# Patient Record
Sex: Male | Born: 1940 | Race: White | Hispanic: No | Marital: Married
Health system: Midwestern US, Community
[De-identification: ages and names within clinical notes are randomized; demographics above are authoritative.]

## PROBLEM LIST (undated history)

## (undated) DIAGNOSIS — I951 Orthostatic hypotension: Principal | ICD-10-CM

---

## 2013-11-11 ENCOUNTER — Inpatient Hospital Stay
Admit: 2013-11-11 | Discharge: 2013-11-11 | Disposition: A | Discharge status: Home / Self Care | Attending: Emergency Medicine

## 2013-11-11 LAB — STREP SCREEN GROUP A THROAT: Rapid Strep A Screen: NEGATIVE

## 2013-11-11 MED ORDER — PSEUDOEPH-BROMPHEN-DM 30-2-10 MG/5ML PO SYRP
2-30-10 MG/5ML | Freq: Four times a day (QID) | ORAL | Status: DC | PRN
Start: 2013-11-11 — End: 2018-09-02

## 2013-11-11 NOTE — ED Provider Notes (Signed)
HPI Comments: Currently on antibiotic for pneumonia, worried about strep throat  Travelling a lot in the last 9 days    Patient is a 73 y.o. male presenting with throat problem. The history is provided by the patient.   Pharyngitis  Location:  Generalized  Quality:  Aching  Severity:  Moderate  Onset quality:  Gradual  Duration:  2 days  Progression:  Worsening  Chronicity:  New  Associated symptoms: no abdominal pain, no adenopathy, no chest pain, no chills, no cough, no ear pain, no eye discharge, no fever, no headaches, no rash and no shortness of breath        Review of Systems   Constitutional: Negative for fever and chills.   HENT: Negative for ear pain, sinus pressure and sore throat.    Eyes: Negative for pain, discharge and redness.   Respiratory: Negative for cough, shortness of breath and wheezing.    Cardiovascular: Negative for chest pain.   Gastrointestinal: Negative for nausea, vomiting, abdominal pain and diarrhea.   Genitourinary: Negative for dysuria and frequency.   Musculoskeletal: Negative for back pain and arthralgias.   Skin: Negative for rash and wound.   Neurological: Negative for weakness and headaches.   Hematological: Negative for adenopathy.   All other systems reviewed and are negative.      Physical Exam   Constitutional: He is oriented to person, place, and time. He appears well-developed and well-nourished.   HENT:   Head: Normocephalic and atraumatic.   Right Ear: Hearing, tympanic membrane and external ear normal.   Left Ear: Hearing, tympanic membrane and external ear normal.   Nose: Mucosal edema present. Right sinus exhibits no maxillary sinus tenderness and no frontal sinus tenderness. Left sinus exhibits no maxillary sinus tenderness and no frontal sinus tenderness.   Mouth/Throat: Uvula is midline, oropharynx is clear and moist and mucous membranes are normal. No trismus in the jaw. No uvula swelling.   Eyes: Conjunctivae, EOM and lids are normal. Pupils are equal, round,  and reactive to light.   Neck: Normal range of motion. Neck supple.   Cardiovascular: Normal rate, regular rhythm and normal heart sounds.    No murmur heard.  Pulmonary/Chest: Effort normal and breath sounds normal. No respiratory distress. He has no wheezes. He has no rales.   Abdominal: Soft. Bowel sounds are normal. There is no tenderness. There is no rigidity, no rebound, no guarding and no CVA tenderness.   Musculoskeletal: He exhibits no edema.   Neurological: He is alert and oriented to person, place, and time. He has normal strength. No cranial nerve deficit or sensory deficit. Coordination and gait normal. GCS eye subscore is 4. GCS verbal subscore is 5. GCS motor subscore is 6.   Skin: Skin is warm and dry. No abrasion and no rash noted.   Nursing note and vitals reviewed.      Procedures    MDM    --------------------------------------------- PAST HISTORY ---------------------------------------------  Past Medical History:  has no past medical history on file.    Past Surgical History:  has no past surgical history on file.    Social History:  reports that he has never smoked. He does not have any smokeless tobacco history on file.    Family History: family history is not on file.     The patient???s home medications have been reviewed.    Allergies: Review of patient's allergies indicates no known allergies.    -------------------------------------------------- RESULTS -------------------------------------------------  Results for orders placed during  the hospital encounter of 11/11/13   STREP SCREEN GROUP A THROAT       Result Value Range    Rapid Strep A Screen Negative  Negative    Rapid A Strep Antigen QC see below            ------------------------- NURSING NOTES AND VITALS REVIEWED ---------------------------   The nursing notes within the ED encounter and vital signs as below have been reviewed.   BP 130/71    Pulse 67    Temp(Src) 98.9 ??F (37.2 ??C) (Oral)    Resp 20    Ht 6\' 2"  (1.88 m)    Wt 195  lb (88.451 kg)    BMI 25.03 kg/m2      SpO2 99%   Oxygen Saturation Interpretation: Normal      ------------------------------------------ PROGRESS NOTES ------------------------------------------   I have spoken with the patient and discussed today???s results, in addition to providing specific details for the plan of care and counseling regarding the diagnosis and prognosis.  Their questions are answered at this time and they are agreeable with the plan.      --------------------------------- ADDITIONAL PROVIDER NOTES ---------------------------------          This patient is stable for discharge.  I have shared the specific conditions for return, as well as the importance of follow-up.        Delma OfficerAsheesh A Pai Dhungat, MD  11/11/13 [phone]

## 2013-11-13 LAB — CULTURE, THROAT

## 2018-09-02 ENCOUNTER — Inpatient Hospital Stay
Admit: 2018-09-02 | Discharge: 2018-09-02 | Disposition: A | Discharge status: Home / Self Care | Payer: BLUE CROSS/BLUE SHIELD

## 2018-09-02 DIAGNOSIS — J209 Acute bronchitis, unspecified: Secondary | ICD-10-CM

## 2018-09-02 MED ORDER — AZITHROMYCIN 250 MG PO TABS
250 MG | PACK | ORAL | 0 refills | Status: AC
Start: 2018-09-02 — End: 2018-09-12

## 2018-09-02 MED ORDER — MUCINEX DM MAXIMUM STRENGTH 60-1200 MG PO TB12
60-1200 MG | ORAL_TABLET | Freq: Two times a day (BID) | ORAL | 0 refills | Status: AC | PRN
Start: 2018-09-02 — End: ?

## 2018-09-02 NOTE — ED Provider Notes (Signed)
78 year old male the presents to urgent care complaining of cough and chest congestion primarily.  Does have some URI symptoms.  Denies any history of smoking.          Review of Systems   Constitutional:        Pertinent positives and negatives are stated within HPI, all other systems reviewed and are negative.       Physical Exam  Vitals signs and nursing note reviewed.   Constitutional:       Appearance: He is well-developed.   HENT:      Head: Normocephalic and atraumatic.      Jaw: No trismus.      Right Ear: Hearing, tympanic membrane, ear canal and external ear normal.      Left Ear: Hearing, tympanic membrane, ear canal and external ear normal.      Nose: Nose normal.      Right Sinus: No maxillary sinus tenderness or frontal sinus tenderness.      Left Sinus: No maxillary sinus tenderness or frontal sinus tenderness.      Mouth/Throat:      Pharynx: Oropharynx is clear. Uvula midline. No uvula swelling.   Eyes:      General: Lids are normal.      Conjunctiva/sclera: Conjunctivae normal.      Pupils: Pupils are equal, round, and reactive to light.   Neck:      Musculoskeletal: Normal range of motion and neck supple.   Cardiovascular:      Rate and Rhythm: Normal rate and regular rhythm.      Heart sounds: Normal heart sounds. No murmur.   Pulmonary:      Effort: Pulmonary effort is normal.      Breath sounds: Normal breath sounds.      Comments: Mild slightly moist cough.  Abdominal:      General: Bowel sounds are normal.      Palpations: Abdomen is soft. Abdomen is not rigid.      Tenderness: There is no abdominal tenderness. There is no guarding or rebound.   Skin:     General: Skin is warm and dry.      Findings: No abrasion or rash.   Neurological:      Mental Status: He is alert and oriented to person, place, and time.      GCS: GCS eye subscore is 4. GCS verbal subscore is 5. GCS motor subscore is 6.      Cranial Nerves: No cranial nerve deficit.      Sensory: No sensory deficit.      Coordination:  Coordination normal.      Gait: Gait normal.         Procedures    MDM    --------------------------------------------- PAST HISTORY ---------------------------------------------  Past Medical History:  has a past medical history of Cancer (HCC).    Past Surgical History:  has no past surgical history on file.    Social History:  reports that he has never smoked. He does not have any smokeless tobacco history on file.    Family History: family history is not on file.     The patient's home medications have been reviewed.    Allergies: Patient has no known allergies.    -------------------------------------------------- RESULTS -------------------------------------------------  No results found for this visit on 09/02/18.  No orders to display       ------------------------- NURSING NOTES AND VITALS REVIEWED ---------------------------   The nursing notes within the ED encounter and vital signs  as below have been reviewed.   BP 100/65   Pulse 73   Temp 98.8 F (37.1 C) (Oral)   Resp 16   Wt 195 lb (88.5 kg)   SpO2 95%   BMI 25.04 kg/m   Oxygen Saturation Interpretation: Normal      ------------------------------------------ PROGRESS NOTES ------------------------------------------   I have spoken with the patient and discussed today's results, in addition to providing specific details for the plan of care and counseling regarding the diagnosis and prognosis.  Their questions are answered at this time and they are agreeable with the plan.      --------------------------------- ADDITIONAL PROVIDER NOTES ---------------------------------     This patient is stable for discharge.  I have shared the specific conditions for return, as well as the importance of follow-up.      * NOTE: This report was transcribed using voice recognition software. Every effort was made to ensure accuracy; however, inadvertent computerized transcription errors may be present.    --------------------------------- IMPRESSION AND  DISPOSITION ---------------------------------    IMPRESSION  1. Acute bronchitis, unspecified organism        DISPOSITION  Disposition: Discharge to home  Patient condition is good         Sherryl Barters, PA-C  09/02/18 1313

## 2022-08-29 NOTE — Progress Notes (Signed)
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 Burr Medico in [place] in 2013  -TRT continued with Androgel in [facility] July 2020   Viagra stopped working a few months ago.  Switched to Cialis and also failed.  Libido is strong on TRT.  N openile curvature.  No dysuria-hematuria.      Has script for Trimix 30/1/10 ; 30 mg papaverine, 1 mg phentolamine, 10 mcg alprostadil / ml  [facility] [place]   Stopped TRT Fall 2023 - was not working     [place]:     Alert and oriented  MOTIVATION TO LEARN:  Eager  FAMILY  SUPPORT:  married , not here   INSTRUCTION PROVIDED TO:  Patient  PATIENT LEARNS BEST BY:   Individual Instruction  FACTORS AFFECTING LEARNING: None  PHYSICAL LIMITATIONS AFFECTING LEARNING:    None  METHOD OF INSTRUCTION:  Individual instruction    EDUCATION TOPIC/  TEACHING POINTS:   Survival Skills:   History of Drug and long term effects reviewed with patient. yes  Discussed with patient to NEVER increase the dose without first calling.yes  The Following Possible Complications were Reviewed:  Bleeding yes  Discomfort and resistance yes  Infection yes  Scarring yes  Priapism yes  Care of Medication discussed yes  Anatomy of penis reviewed.yes  Syringe and needle size reviewed, 29 gauge 1/2 inch U100. yes  Sterility Emphasized:yes  Syringe preparation discussed and demonstrated. yes  Emphasized removing air bubbles. yes  Patient practiced preparing syringe 2 times. yes  Skin preparation discussed and done by patient. yes  Patient injected medication. yes  Patient compressed injection site 3-5 minutes. yes  Usage of medication discussed:  (1 time per 24 hours 2-3 times a week). yes  Patient told to rotate sites. yes  Patient told to keep notes on injections.  yes  Patient instructed to call after using x1. yes    LEARNING RESPONSE  PATIENT / FAMILY RESPONSE:     Performs skill independently: Anatomy of penis   Syringe and needle size   Sterility principles  Syringe preparation and injection technique  Verbalizes understanding of: History of drug and long term effects   Dose increase   Possible complications   Care of medication   Anatomy of penis   Syringe and needle size   Sterility principles  Syringe preparation and injection technique  FOLLOW-UP PLAN REVIEWED:    yes  SUPPLEMENTAL MATERIAL:    Written instructions given to the patient and read in the office.yes  REFERRAL (RECOMMENDATION):   None   Injection results:   10 units Trimix 30/1/10 ; 30 mg papaverine, 1 mg phentolamine, 10 mcg alprostadil / ml   At 3: 48   Homegoing medication: 20 units.    Pt aware to report to [facility] emergency room for erection lasting longer than 3-4 hrs. He is to bring the medication with him to ER.    I spent 45 minutes in the visit, with more than 50% of the total face-to-face time of the visit in counseling / coordination of care.  Proper use of medication, side effects, and expected outcomes discussed.      Electronically Signed  By Irma Newness, APRN.CNP   In  Department:  UROLOGY

## 2022-10-02 ENCOUNTER — Inpatient Hospital Stay
Admit: 2022-10-02 | Discharge: 2022-10-02 | Disposition: A | Discharge status: Home / Self Care | Payer: BLUE CROSS/BLUE SHIELD | Attending: Emergency Medicine

## 2022-10-02 DIAGNOSIS — N4830 Priapism, unspecified: Secondary | ICD-10-CM

## 2022-10-02 MED ORDER — SODIUM CHLORIDE 0.9 % IV BOLUS
0.9 | Freq: Once | INTRAVENOUS | Status: AC
Start: 2022-10-02 — End: 2022-10-02
  Administered 2022-10-02: 23:00:00 1000 mL via INTRAVENOUS

## 2022-10-02 MED ORDER — PSEUDOEPHEDRINE HCL 30 MG PO TABS
30 | Freq: Once | ORAL | Status: AC
Start: 2022-10-02 — End: 2022-10-02
  Administered 2022-10-02: 23:00:00 30 mg via ORAL

## 2022-10-02 MED FILL — SUDOGEST 30 MG PO TABS: 30 MG | ORAL | Qty: 1

## 2022-10-02 NOTE — ED Provider Notes (Cosign Needed)
[facility] EMERGENCY DEPARTMENT  EMERGENCY DEPARTMENT ENCOUNTER        Pt Name: Jesus Morgan  MRN: [id number]  Birthdate [birth year]  Date of evaluation: 10/02/2022  Provider: Cleon Gustin, DO  PCP: No primary care provider on file.  Note Started: 5:45 PM EST 10/02/22    CHIEF COMPLAINT       Chief Complaint   Patient presents with    Other     INJECTION MED INTO PENIS STILL HAS ERECTION  AFTER 4 HOURS       HISTORY OF PRESENT ILLNESS: 1 or more Elements   History From: Patient    Limitations to history : None    Jesus Morgan is a 82 y.o. male with past medical history of unspecified cancer who presents to the emergency department due to prolonged erection.  Patient states that he injected his penis with a medication to establish an erection.  He elicits prolonged erection for the last 4 and half hours.  This is only slightly improving since arrival to the emergency department.  Patient denies any penile pain.  He has no other symptoms otherwise including nausea, vomiting, lightheadedness, dizziness, syncope, chest pain, shortness of breath, abdominal pain, flank pain, urinary symptoms or bowel changes.  Patient is still able to urinate without difficulty.  He states that he has done this 3 other times and has not had a problem with a prolonged erection.  He denies any trauma or injury to the penis.    Nursing Notes were all reviewed and agreed with or any disagreements were addressed in the HPI.    ROS:   Pertinent positives and negatives are stated within HPI, all other systems reviewed and are negative.    --------------------------------------------- PAST HISTORY ---------------------------------------------  Past Medical History:  has a past medical history of Cancer (HCC).    Past Surgical History:  has no past surgical history on file.    Social History:  reports that he has never smoked. He does not have any smokeless tobacco history on file.    Family History: family  history is not on file.     The patient's home medications have been reviewed.    Allergies: Patient has no known allergies.    ---------------------------------------------------PHYSICAL EXAM--------------------------------------  Constitutional/General: well appearing, non toxic in NAD  Neck: Supple, full ROM  Pulmonary: Lungs clear to auscultation bilaterally, no wheezes, rales, or rhonchi. Not in respiratory distress  Cardiovascular:  Regular rate. Regular rhythm.   Abdomen: Soft.  Non tender. Non distended.   No rebound, guarding, or rigidity.   GU: With chaperone, nurse present.  Penis is erect but overall soft with no ischemic color changes.  Skin is warm and pink.  Musculoskeletal: Moves all extremities x 4. Warm and well perfused, no clubbing, cyanosis, or edema.  Skin: warm and dry. No rashes.   Psych: Normal Affect    -------------------------------------------------- RESULTS -------------------------------------------------  I have personally reviewed all laboratory and imaging results for this patient. Results are listed below.     LABS:  No results found for this visit on 10/02/22.    RADIOLOGY:   Interpretation per the Radiologist below, if available at the time of this note:    No orders to display     No results found.    No results found.    ------------------------- NURSING NOTES AND VITALS REVIEWED ---------------------------   The nursing notes within the ED encounter and vital signs as below have been reviewed by myself.  BP 132/74   Pulse 78   Temp 98.3 F (36.8 C)   Resp 20   Wt 83.9 kg (185 lb)   SpO2 98%   BMI 23.75 kg/m   Oxygen Saturation Interpretation: Normal    The patient's available past medical records and past encounters were reviewed.        ------------------------------ ED COURSE/MEDICAL DECISION MAKING----------------------  Medications   sodium chloride 0.9 % bolus 1,000 mL (0 mLs IntraVENous Stopped 10/02/22 1823)   pseudoephedrine (SUDAFED) tablet 30 mg (30 mg Oral  Given 10/02/22 1823)       Medical Decision Making/Differential Diagnosis:    CC/HPI Summary, Pertinent Physical Exam Findings, Social Determinants of health, Records Reviewed, DDx, testing done/not done, ED Course, Reassessment, disposition considerations/shared decision making with patient, consults, disposition:        Medical Decision Making:   I, Dr. Cleon Gustin am the resident physician of record.      History From: Patient    Limitations to history : None     Jesus Morgan is a 82 y.o. male who presents to the ED for prolonged erection.  Vital signs upon arrival BP 132/74   Pulse 78   Temp 98.3 F (36.8 C)   Resp 20   Wt 83.9 kg (185 lb)   SpO2 98%   BMI 23.75 kg/m     On initial evaluation, patient is nontoxic-appearing, afebrile, hemodynamically stable and in no acute distress.  Initial vitals were notable limits.  Differential diagnosis includes was not limited to priapism, Peyronie's, penile fracture, phimosis or paraphimosis.  Physical exam was remarkable for penile erection with no ischemic changes to the penis.  Skin was warm and pink.  No tense firmness.  No other concerning physical exam findings.  Lungs are clear auscultation bilaterally.  Heart regular rhythm with normal rate.  Abdomen soft, nontender nondistended.  Skin normal color with no paleness or jaundice.  No other concerning physical exam findings.  Urology was consulted and advised draining the penis as well as giving Sudafed.  Procedure was completed the emergency department and patient tolerated procedure well.  A moderate amount of blood was aspirated from the penis which resolved the priapism.  Patient was observed in the emergency department and remained asymptomatic without return of his presenting symptoms.  Discussed plan for discharge with patient who is agreeable.  Return precautions were discussed and patient was discharged in stable condition.  He was advised to follow-up with his neurologist and PCP as an outpatient  as needed for further evaluation.    Medications this visit include:   Orders Placed This Encounter   Medications    sodium chloride 0.9 % bolus 1,000 mL    pseudoephedrine (SUDAFED) tablet 30 mg       Is this patient to be included in the SEP-1 core measure? No Exclusion criteria - the patient is NOT to be included for SEP-1 Core Measure due to: Infection is not suspected    No imaging studies indicated for this visit    Discussion with Other Professionals: See ED course  CONSULTS: discussion with bolded "IP consult", otherwise consult was likely placed by admitting service  None    Social Determinants : None    Records Reviewed : Other office visit from 08/27/2022 reviewed.  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 Mosetta Putt in [facility] in 2013  -TRT continued with Androgel in [facility] July 2020   Viagra stopped working a few months ago. Switched to Cialis and also failed. Libido is strong on TRT. N openile curvature. No dysuria-hematuria.   Has script for Trimix 30/1/10 ; 30 mg papaverine, 1 mg phentolamine, 10 mcg alprostadil / ml  [facility] [place]   Stopped TRT Fall 2023 - was not working"     Chronic conditions:  unspecified cancer     CONSULTS: Urology      Disposition:   Appropriate for outpatient management      Pt will be d/c and will follow up with his PCP . He is educated on signs and symptoms that require emergent evaluation. Pt is advised to return to the ED if his symptoms change or worsen. If his pain persists, pt may need further evaluation. Pt is agreeable to plan and all questions have been answered at this time.      1. Priapism          Re-Evaluations/Consultations:             ED Course as of 10/02/22 Jesus Morgan Oct 02, 2022   1718 Reviewed medication patient to inject his  penis: Papaverine plus phentolamine (Bimix) [PP]   1726 I spoke with urology concerning the patient.  They recommend that we drain the penis with a 18-gauge needle on the side of the corpus callosum.  Also advised to give Sudafed and consider phenylephrine injection if drainage is not enough.  Give IV fluids and oxygen to the patient as well for the procedure [PP]   1840 Patient re-evaluated. Penis is still flaccid after procedure. He is having no pain or new symptoms. He is agreeable to discharge. Return precautions discussed including but not limited to continued erection, penile pain, lightheadedness, dizziness or syncope.  [PP]   1840   ATTENDING PROVIDER ATTESTATION:     I have personally performed and/or participated in the history, exam, medical decision making, and procedures and agree with all pertinent clinical information unless otherwise noted.    I have also reviewed and agree with the past medical, family and social history unless otherwise noted.    I have discussed this patient in detail with the resident and provided the instruction and education regarding the evidence-based evaluation and treatment of prolonged erection.    Any EKG that may have been performed has been personally reviewed by me and I agree with the documentation as noted by the resident.    History: patient used an injection to obtain an erection.  It has not gone down and its over 4 hours.  He denies pain.  This has never occurred before.    My findings: Jesus Morgan is a 82 y.o. male whom is in no distress. Physical exam reveals erected penis.  Abdomen is soft and nontender.    My plan: Symptomatic and supportive care. We drained the extra blood with good results.    Electronically signed by Susy Manor, DO on 10/02/22 at 6:47 PM EST       [JS]      ED Course User Index  [JS] Stefanucci-Uberti, Noreene Larsson, DO  [PP] Cleon Gustin, DO         This patient's ED course included: History, physical examination, reevaluation prior  to disposition    This patient has remained hemodynamically stable during their ED course.    Counseling:  The emergency provider has spoken with the patient and discussed today's results, in addition to providing specific details for the plan of care and counseling regarding the diagnosis and prognosis.  Questions are answered at this time and they are agreeable with the plan.       --------------------------------- IMPRESSION AND DISPOSITION ---------------------------------    IMPRESSION  1. Priapism        DISPOSITION  Disposition: Discharge to home  Patient condition is stable        NOTE: This report was transcribed using voice recognition software. Every effort was made to ensure accuracy; however, inadvertent computerized transcription errors may be present  \

## 2022-10-02 NOTE — Discharge Instructions (Addendum)
Thank you for the opportunity to serve in your medical care today. Please be sure to take your prescribed medication as directed. Follow up with your doctor is critical for optimal healing. Should you have any new or worsening symptoms, please return to the Emergency Department for further work-up and evaluation.

## 2023-10-26 IMAGING — MR MRA BRAIN WITHOUT CONTRAST
3 of 5 series · 7 of 48 positions shown · non-contrast
Comparison: None.

________________________________________________________________________________________________ 
MRA BRAIN WITHOUT CONTRAST, 10/26/2023 [DATE]: 
CLINICAL INDICATION: Lightheadedness, dizziness.
TECHNIQUE: MR arteriography with MIPs of the brain is performed with computer 
reformatting of the source data to create arteriographic images. Patient was 
scanned on a 1.5 Tesla magnet.

[Series 301: 3d_tof_cow · axial · 1.0mm · 0.38mm/px · z∈[-50,+18]mm · 3 of 200 slices shown]
[im 32/200]
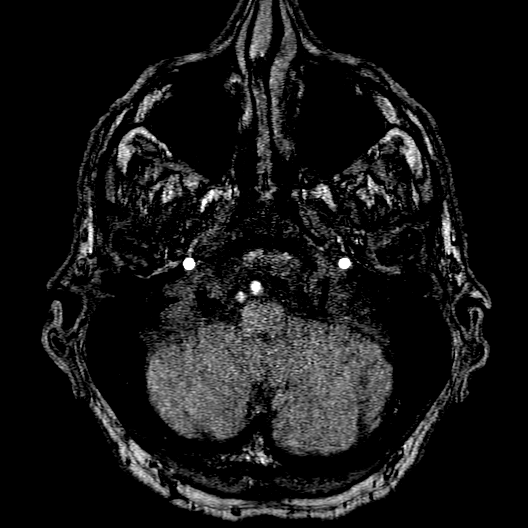
[im 104/200]
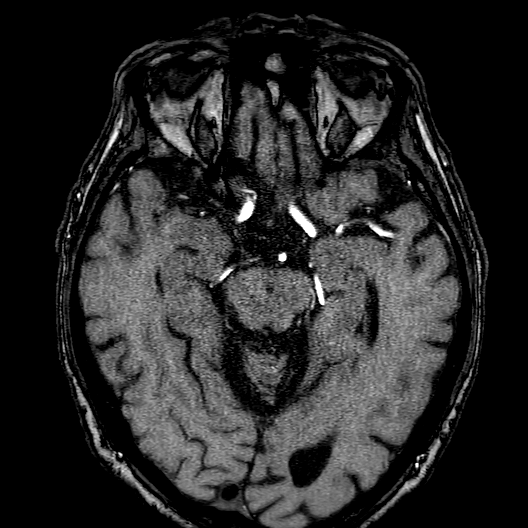
[im 168/200]
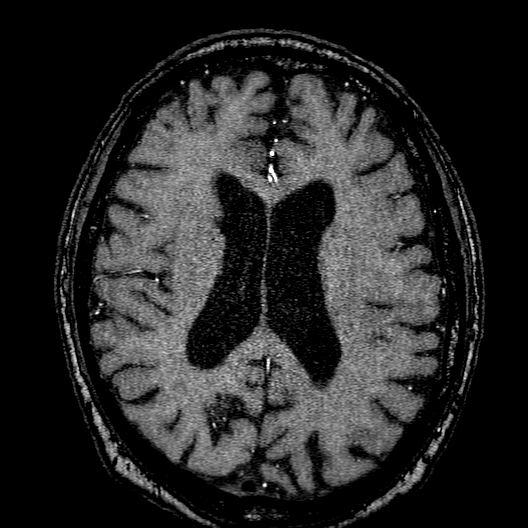

[Series 303: cor mpr · coronal · 2.0mm · 0.20mm/px · 3 of 65 slices shown]
[im 10/65]
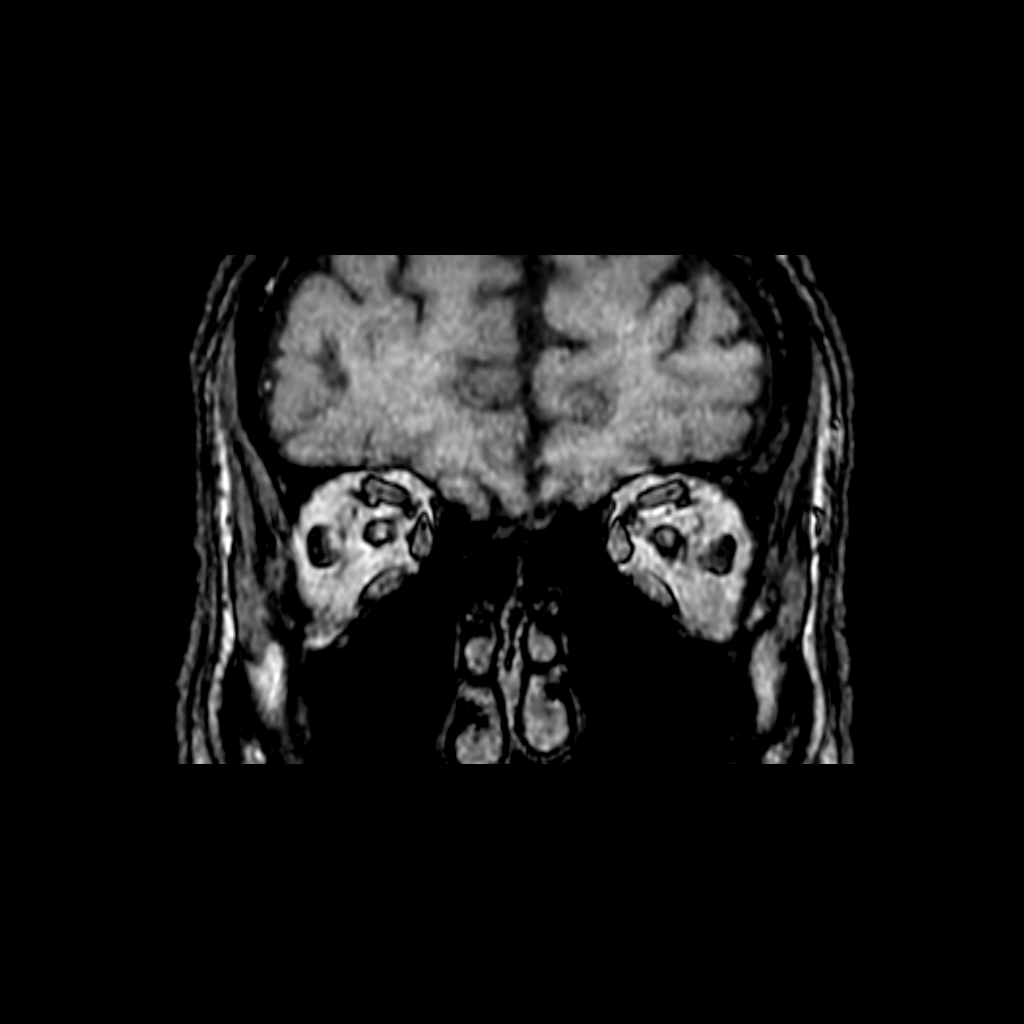
[im 37/65]
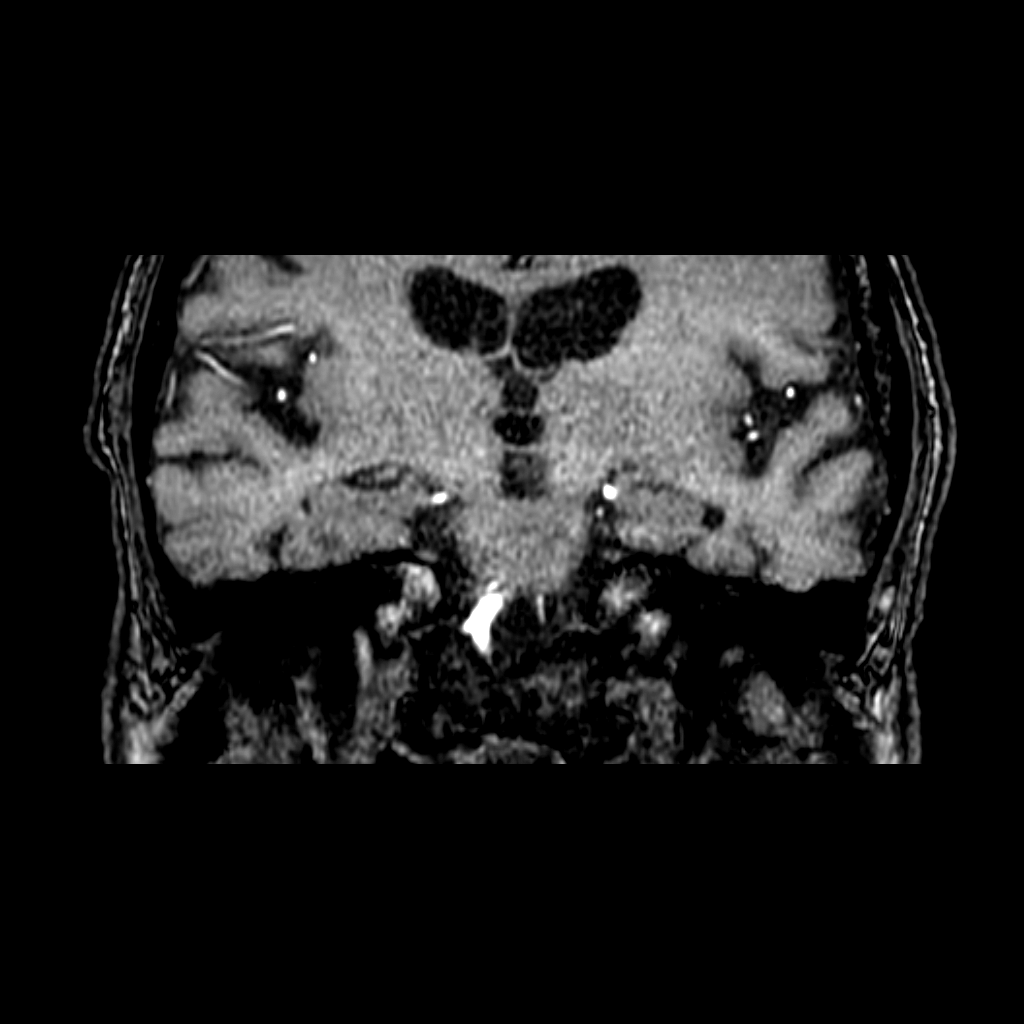
[im 55/65]
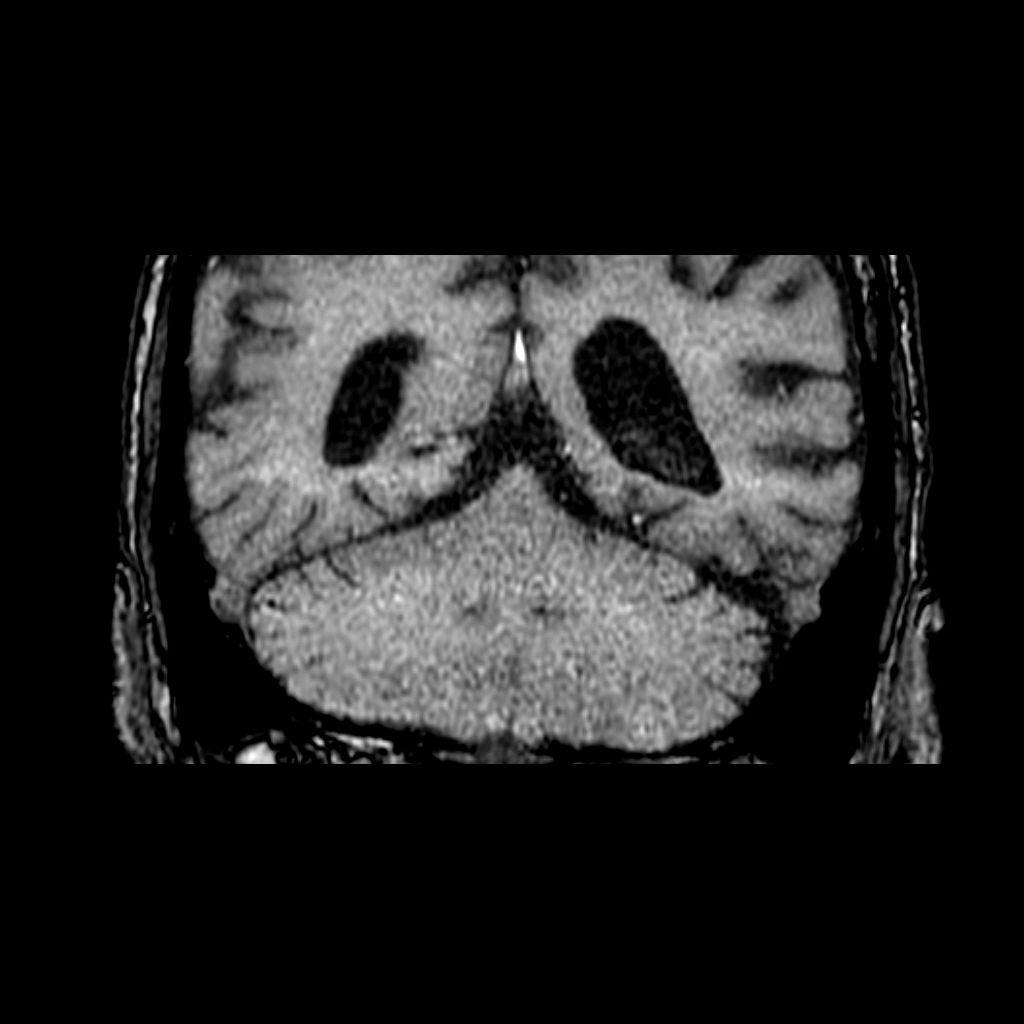

[Series 304: sag mpr · sagittal · 2.0mm · 0.20mm/px · 1 of 65 slices shown]
[im 10/65]
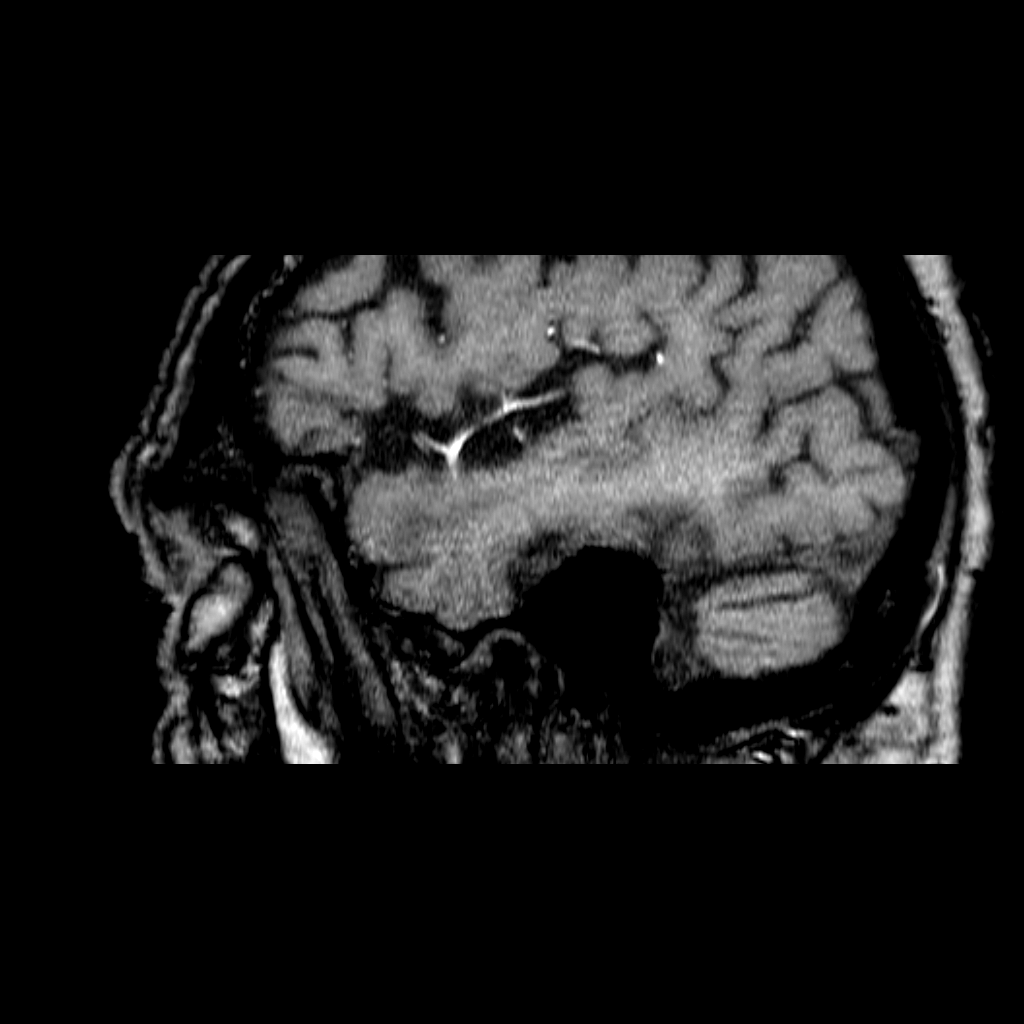

[7 of 48 positions shown; findings below may reference images not displayed]

FINDINGS: RIGHT ANTERIOR CIRCULATION: Distal internal carotid artery, anterior and middle 
cerebral arteries are patent without significant stenosis. 

LEFT ANTERIOR CIRCULATION: Distal internal carotid artery, anterior and middle 
cerebral arteries are patent without significant stenosis. 

POSTERIOR CIRCULATION: Distal vertebral arteries, basilar artery, and proximal 
posterior cerebral arteries are patent without significant stenosis. 

ANEURYSM/AVM: No aneurysm. Please note, MRA is less sensitive for aneurysms less 
than 4 mm in size.  There is no vascular nidus to suggest high-grade 
arteriovenous malformation.  
OTHER: No acute ischemia in the brain. Suspected encephalomalacia along the left 
thalamus and right caudate head from chronic ischemic changes.
IMPRESSION: 1.  Patency of intracranial vasculature. 
2.  Chronic ischemic changes along the right caudate head and left thalamus.

## 2023-10-26 IMAGING — MR MRA CAROTID WITHOUT CONTRAST
6 series · 16 of 16 positions shown · non-contrast
Comparison: None.

________________________________________________________________________________________________ 
MRA CAROTID WITHOUT CONTRAST, 10/26/2023 [DATE]:
INDICATION: Lightheadedness. Dizziness.
TECHNIQUE: 2D TOF through the neck and 3D TOF through the carotid bifurcations. 
MIP reconstructions performed.

[Series 501: survey · axial · 10.0mm · 1.07mm/px · 1 of 10 slices shown]
[im 1/10]
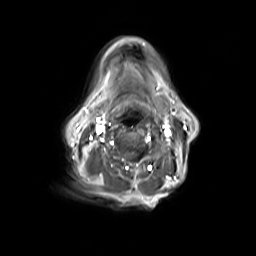

[Series 601: survey_pca · sagittal · 50.0mm · 1.17mm/px · 1 of 3 slices shown]
[im 1/3]
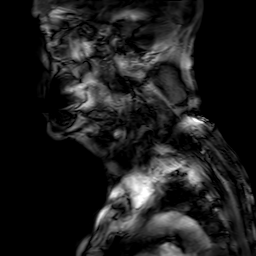

[Series 701: 3di_mc · axial · 1.2mm · 0.43mm/px · z∈[-178,-119]mm · 6 of 100 slices shown]
[im 1/100]
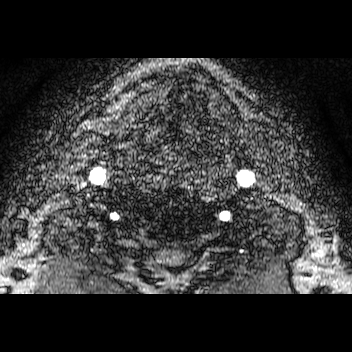
[im 20/100]
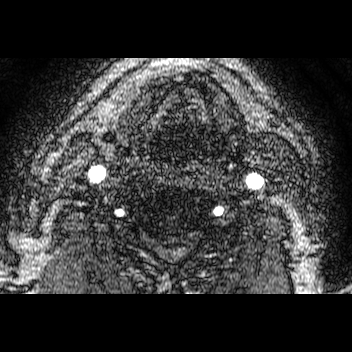
[im 40/100]
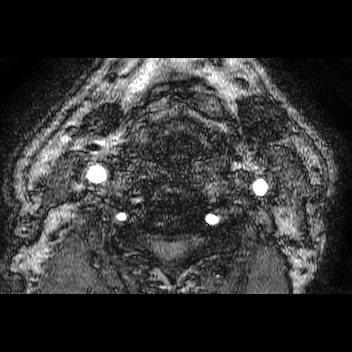
[im 60/100]
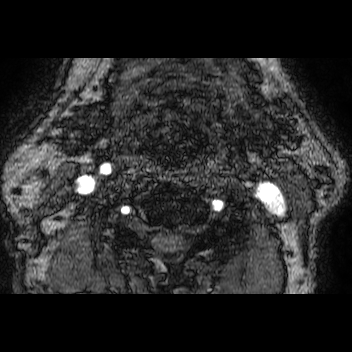
[im 80/100]
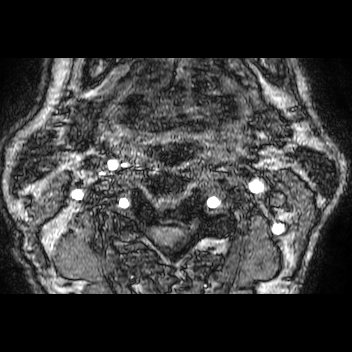
[im 100/100]
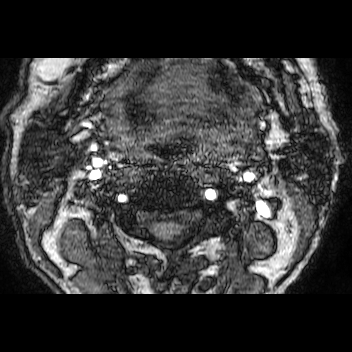

[Series 702: mip - 3di_mc · axial · 150.0mm · 0.43mm/px · 1 of 1 slices shown]
[im 1/1]
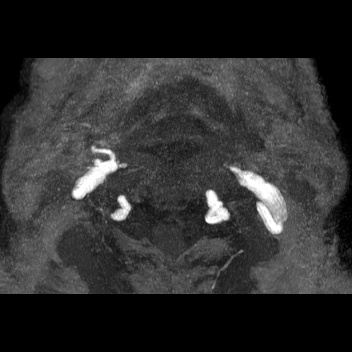

[Series 801: m2di · axial · 3.0mm · 0.44mm/px · z∈[-250,-55]mm · 6 of 100 slices shown]
[im 1/100]
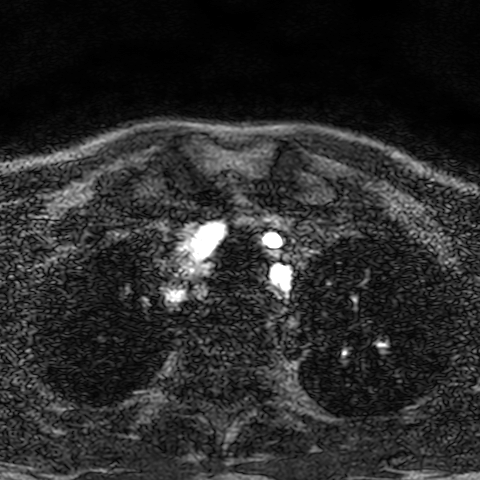
[im 20/100]
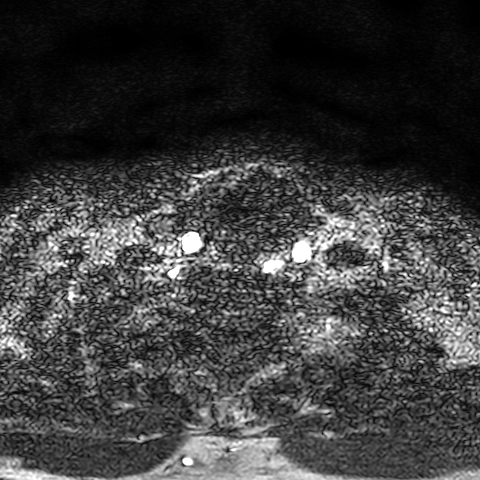
[im 40/100]
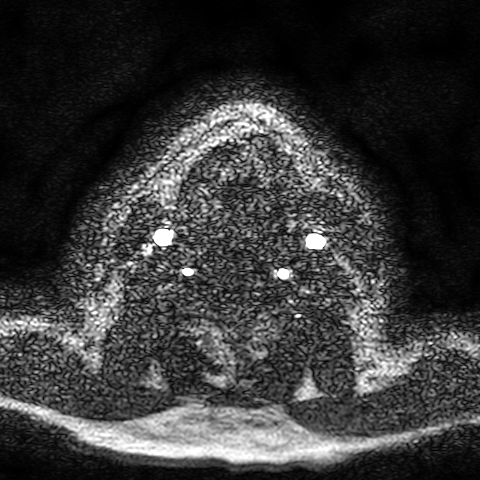
[im 60/100]
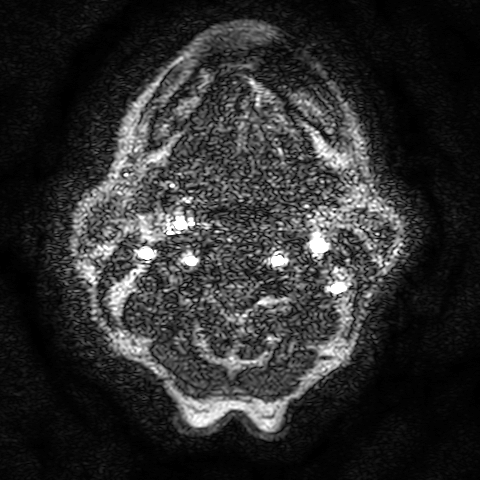
[im 80/100]
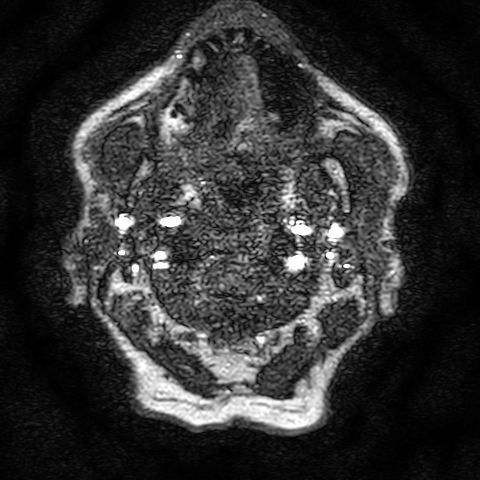
[im 100/100]
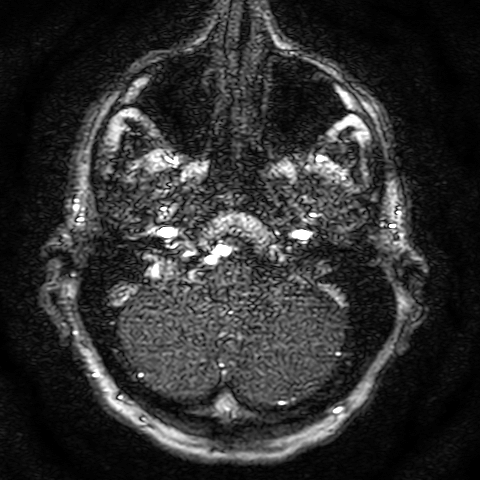

[Series 802: mip - m2di · axial · 210.0mm · 0.44mm/px · 1 of 1 slices shown]
[im 1/1]
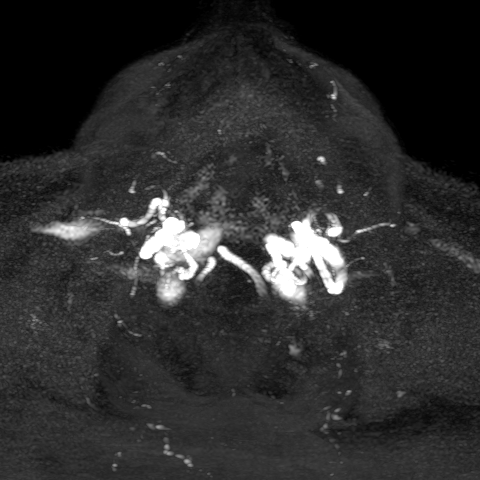

[16 of 16 positions shown; findings below may reference images not displayed]

FINDINGS: MRA of the neck demonstrates patency of the cervical vasculature without 
evidence of flow gap (flow gap correlates with greater than 70% stenosis based 
on NASCET criteria).  There is mild narrowing of the proximal left cervical 
internal carotid artery by atherosclerotic plaque.
IMPRESSION: 1.  Mild left proximal cervical ICA narrowing by atherosclerotic plaque.  
2.  Patency of cervical vasculature.

## 2023-12-09 ENCOUNTER — Inpatient Hospital Stay
Admit: 2023-12-09 | Discharge: 2023-12-09 | Disposition: A | Discharge status: Home / Self Care | Payer: Medicare (Managed Care) | Attending: Emergency Medicine

## 2023-12-09 ENCOUNTER — Emergency Department: Admit: 2023-12-09 | Payer: Medicare (Managed Care)

## 2023-12-09 DIAGNOSIS — R42 Dizziness and giddiness: Secondary | ICD-10-CM

## 2023-12-09 LAB — TROPONIN
Troponin, High Sensitivity: 13 ng/L (ref 0–22)
Troponin, High Sensitivity: 12 ng/L (ref 0–22)

## 2023-12-09 LAB — CBC WITH AUTO DIFFERENTIAL
Basophils %: 0 % (ref 0.0–2.0)
Basophils Absolute: 0.01 10*3/uL (ref 0.00–0.20)
Eosinophils %: 1 % (ref 0–6)
Eosinophils Absolute: 0.07 10*3/uL (ref 0.05–0.50)
Hematocrit: 39.2 % (ref 37.0–54.0)
Hemoglobin: 13.5 g/dL (ref 12.5–16.5)
Immature Granulocytes %: 0 % (ref 0.0–5.0)
Immature Granulocytes Absolute: <0.03 10*3/uL (ref 0.00–0.58)
Lymphocytes %: 19 % — ABNORMAL LOW (ref 20.0–42.0)
Lymphocytes Absolute: 1.2 10*3/uL — ABNORMAL LOW (ref 1.50–4.00)
MCH: 32.2 pg (ref 26.0–35.0)
MCHC: 34.4 g/dL (ref 32.0–34.5)
MCV: 93.6 fL (ref 80.0–99.9)
MPV: 10.7 fL (ref 7.0–12.0)
Monocytes %: 6 % (ref 2.0–12.0)
Monocytes Absolute: 0.38 10*3/uL (ref 0.10–0.95)
Neutrophils %: 73 % (ref 43.0–80.0)
Neutrophils Absolute: 4.49 10*3/uL (ref 1.80–7.30)
Platelets: 226 10*3/uL (ref 130–450)
RBC: 4.19 m/uL (ref 3.80–5.80)
RDW: 13.2 % (ref 11.5–15.0)
WBC: 6.2 10*3/uL (ref 4.5–11.5)

## 2023-12-09 LAB — BASIC METABOLIC PANEL
Anion Gap: 12 mmol/L (ref 7–16)
BUN: 18 mg/dL (ref 6–23)
CO2: 21 mmol/L — ABNORMAL LOW (ref 22–29)
Calcium: 8.7 mg/dL (ref 8.6–10.2)
Chloride: 105 mmol/L (ref 98–107)
Creatinine: 0.9 mg/dL (ref 0.70–1.20)
Est, Glom Filt Rate: 85 mL/min/{1.73_m2} (ref >60)
Glucose: 89 mg/dL (ref 74–99)
Potassium: 3.8 mmol/L (ref 3.5–5.0)
Sodium: 138 mmol/L (ref 132–146)

## 2023-12-09 LAB — BRAIN NATRIURETIC PEPTIDE: NT Pro-BNP: 420 pg/mL (ref 0–450)

## 2023-12-09 LAB — MAGNESIUM: Magnesium: 2 mg/dL (ref 1.6–2.6)

## 2023-12-09 MED ORDER — TETANUS-DIPHTHERIA TOXOIDS TD 5-2 LFU IM INJ
5-2 | Freq: Once | INTRAMUSCULAR | Status: DC
Start: 2023-12-09 — End: 2023-12-09

## 2023-12-09 NOTE — ED Notes (Signed)
 Ginger in er wr , please call pivot when able to visit

## 2023-12-09 NOTE — ED Provider Notes (Signed)
 83 year old male presenting with lightheadedness.  There was report of recurrent near syncope but no actual syncope.  Patient recently came back from [place]  where he spends his winters.  He was seeing a neurologist in [facility] for chronic neck pain.  He takes midodrine and Lyrica as the only 2 medications.  He cannot provide any other history, he does not have a family doctor, reports that he did see a cardiologist in [place]  when he was prescribed the midodrine.         No family history on file.  No past surgical history on file.    Review of Systems   Constitutional:  Negative for chills and fever.   Respiratory:  Negative for chest tightness and shortness of breath.    Neurological:  Positive for syncope and light-headedness.        Physical Exam  Constitutional:       General: He is not in acute distress.     Appearance: He is well-developed.   HENT:      Head: Normocephalic and atraumatic.   Eyes:      Pupils: Pupils are equal, round, and reactive to light.   Neck:      Thyroid: No thyromegaly.   Cardiovascular:      Rate and Rhythm: Normal rate and regular rhythm.   Pulmonary:      Effort: Pulmonary effort is normal. No respiratory distress.      Breath sounds: Normal breath sounds. No wheezing.   Abdominal:      General: There is no distension.      Palpations: Abdomen is soft. There is no mass.      Tenderness: There is no abdominal tenderness. There is no guarding or rebound.   Musculoskeletal:         General: No tenderness. Normal range of motion.      Cervical back: Normal range of motion and neck supple.   Skin:     General: Skin is warm and dry.      Findings: No erythema.      Comments: Abrasion to the right elbow   Neurological:      Mental Status: He is alert and oriented to person, place, and time.      Cranial Nerves: No cranial nerve deficit.   Psychiatric:         Mood and Affect: Mood normal.          Procedures     MDM       History From: Patient and spouse    CC/HPI Summary, DDx,  ED Course, Reassessment, Tests Considered, Patient expectation:   Patient presents with concern about lightheadedness.  He got out of bed this morning, had couple distinct episodes of lightheadedness.  He was started on midodrine about a year ago because he had recurrent and persistent orthostatic hypotension.  Upon arrival he is awake alert oriented x 4, no distress.  He has no chest pain or shortness of breath.  Considered STEMI, NSTEMI, ACS, hypertension, lightheadedness amongst many other possibilities.  No sign of STEMI or NSTEMI, he does not have ongoing symptoms.  He was a little bit lightheaded when standing up but had not taken his medication.  Blood pressure improved in the emergency department.  He will follow-up with him doctor, understands he return to the ED for any problems any worsening.    Social Determinants affecting Dx or Tx: Stress    Chronic Conditions: Orthostatic hypotension, cervical spinal stenosis, cervical radiculopathy  Records Reviewed: Telemedicine visit on 12/06/2023 by Dr. Arabella Beach with spinal stenosis in the cervical region, from neurology from [facility] clinic.      ED Course as of 12/09/23 1604   Sat Dec 09, 2023   1001 EKG: Interpreted by me  Sinus bradycardia, rate of 48, normal axis, no ST elevations or depressions no T wave abnormalities. [SO]   1208 Patient resting comfortably in no distress.  He is fully awake alert and oriented.  His wife describes a history of longstanding and known orthostatic hypotension.  He was given midodrine a little over a month ago and has done really well since then.  Today is the first time he is had any issues relating to it.  They slept in much later than normal and he did not take his medicine for he got up and have these episodes.  He takes 5 mg of the midodrine 3 times daily [SO]      ED Course User Index  [SO] Basilio Both, DO        ED Course as of 12/09/23 1604   Sat Dec 09, 2023   1001 EKG: Interpreted by me  Sinus bradycardia, rate of  48, normal axis, no ST elevations or depressions no T wave abnormalities. [SO]   1208 Patient resting comfortably in no distress.  He is fully awake alert and oriented.  His wife describes a history of longstanding and known orthostatic hypotension.  He was given midodrine a little over a month ago and has done really well since then.  Today is the first time he is had any issues relating to it.  They slept in much later than normal and he did not take his medicine for he got up and have these episodes.  He takes 5 mg of the midodrine 3 times daily [SO]      ED Course User Index  [SO] Basilio Both, DO       --------------------------------------------- PAST HISTORY ---------------------------------------------  Past Medical History:  has a past medical history of Cancer (HCC).    Past Surgical History:  has no past surgical history on file.    Social History:  reports that he has never smoked. He does not have any smokeless tobacco history on file.    Family History: family history is not on file.     The patient's home medications have been reviewed.    Allergies: Patient has no known allergies.    -------------------------------------------------- RESULTS -------------------------------------------------  Labs:  Results for orders placed or performed during the hospital encounter of 12/09/23   CBC with Auto Differential   Result Value Ref Range    WBC 6.2 4.5 - 11.5 k/uL    RBC 4.19 3.80 - 5.80 m/uL    Hemoglobin 13.5 12.5 - 16.5 g/dL    Hematocrit 16.1 09.6 - 54.0 %    MCV 93.6 80.0 - 99.9 fL    MCH 32.2 26.0 - 35.0 pg    MCHC 34.4 32.0 - 34.5 g/dL    RDW 04.5 40.9 - 81.1 %    Platelets 226 130 - 450 k/uL    MPV 10.7 7.0 - 12.0 fL    Neutrophils % 73 43.0 - 80.0 %    Lymphocytes % 19 (L) 20.0 - 42.0 %    Monocytes % 6 2.0 - 12.0 %    Eosinophils % 1 0 - 6 %    Basophils % 0 0.0 - 2.0 %    Immature Granulocytes % 0 0.0 -  5.0 %    Neutrophils Absolute 4.49 1.80 - 7.30 k/uL    Lymphocytes Absolute 1.20 (L) 1.50 -  4.00 k/uL    Monocytes Absolute 0.38 0.10 - 0.95 k/uL    Eosinophils Absolute 0.07 0.05 - 0.50 k/uL    Basophils Absolute 0.01 0.00 - 0.20 k/uL    Immature Granulocytes Absolute <0.03 0.00 - 0.58 k/uL   Basic Metabolic Panel   Result Value Ref Range    Sodium 138 132 - 146 mmol/L    Potassium 3.8 3.5 - 5.0 mmol/L    Chloride 105 98 - 107 mmol/L    CO2 21 (L) 22 - 29 mmol/L    Anion Gap 12 7 - 16 mmol/L    Glucose 89 74 - 99 mg/dL    BUN 18 6 - 23 mg/dL    Creatinine 0.9 1.61 - 1.20 mg/dL    Est, Glom Filt Rate 85 >60 mL/min/1.17m2    Calcium 8.7 8.6 - 10.2 mg/dL   Troponin   Result Value Ref Range    Troponin, High Sensitivity 13 0 - 22 ng/L   Brain Natriuretic Peptide   Result Value Ref Range    NT Pro-BNP 420 0 - 450 pg/mL   Magnesium   Result Value Ref Range    Magnesium 2.0 1.6 - 2.6 mg/dL   Troponin   Result Value Ref Range    Troponin, High Sensitivity 12 0 - 22 ng/L   EKG 12 Lead   Result Value Ref Range    Ventricular Rate 48 BPM    Atrial Rate 48 BPM    P-R Interval 158 ms    QRS Duration 92 ms    Q-T Interval 500 ms    QTc Calculation (Bazett) 446 ms    P Axis 63 degrees    R Axis 78 degrees    T Axis 78 degrees       Radiology:  XR CHEST PORTABLE   Final Result   No acute cardiopulmonary process.             ------------------------- NURSING NOTES AND VITALS REVIEWED ---------------------------  Date / Time Roomed:  12/09/2023  9:28 AM  ED Bed Assignment:  02/02    The nursing notes within the ED encounter and vital signs as below have been reviewed.   BP (!) 125/59   Pulse 52   Temp 97.7 F (36.5 C)   Resp 23   SpO2 95%   Oxygen Saturation Interpretation: Normal      ------------------------------------------ PROGRESS NOTES ------------------------------------------  I have spoken with the spouse and patient and discussed today's results, in addition to providing specific details for the plan of care and counseling regarding the diagnosis and prognosis.  Their questions are answered at this time and  they are agreeable with the plan. I discussed at length with them reasons for immediate return here for re evaluation. They will followup with primary care by calling their office tomorrow.      --------------------------------- ADDITIONAL PROVIDER NOTES ---------------------------------  At this time the patient is without objective evidence of an acute process requiring hospitalization or inpatient management.  They have remained hemodynamically stable throughout their entire ED visit and are stable for discharge with outpatient follow-up.     The plan has been discussed in detail and they are aware of the specific conditions for emergent return, as well as the importance of follow-up.      Discharge Medication List as of 12/09/2023  1:44 PM  Diagnosis:  1. Orthostatic lightheadedness        Disposition:  Patient's disposition: Discharge to home  Patient's condition is stable.              Basilio Both, DO  12/09/23 1606

## 2023-12-09 NOTE — Discharge Instructions (Signed)
 Continuity of Care Form    Patient Name: Jesus Morgan   DOB:  Oct 04, 1940  MRN:  [id number]    Admit date:  12/09/2023  Discharge date:  ***    Code Status Order: No Order   Advance Directives:     Admitting Physician:  No admitting provider for patient encounter.  PCP: No primary care provider on file.    Discharging Nurse: [facility] Unit/Room#: 02/02  Discharging Unit Phone Number: ***    Emergency Contact:   Extended Emergency Contact Information  Primary Emergency Contact: Mcbreen,Ginger  Address: [address]           [place], [place] [address] [place]  Home Phone: [phone]  Relation: Spouse    Past Surgical History:  No past surgical history on file.    Immunization History:   Immunization History   Administered Date(s) Administered    COVID-19, PFIZER Bivalent, DO NOT Dilute, (age 12y+), IM, 30 mcg/0.3 mL 04/26/2021    COVID-19, PFIZER GRAY top, DO NOT Dilute, (age 34 y+), IM, 30 mcg/0.3 mL 11/19/2020    COVID-19, PFIZER PURPLE top, DILUTE for use, (age 96 y+), 89mcg/0.3mL 09/21/2019, 05/28/2020    COVID-19, PFIZER, 2024/25, (age 12y+), IM, 21mcg/0.3mL 05/01/2023       Active Problems:  There is no problem list on file for this patient.      Isolation/Infection:   Isolation            No Isolation          Patient Infection Status    None to display         Nurse Assessment:  Last Vital Signs: BP (!) 125/59   Pulse 52   Temp 97.7 F (36.5 C)   Resp 23   SpO2 95%     Last documented pain score (0-10 scale):    Last Weight:   Wt Readings from Last 1 Encounters:   10/02/22 83.9 kg (185 lb)     Mental Status:  {IP PT MENTAL STATUS:20030}    IV Access:  {MH COC IV ACCESS:304088262}    Nursing Mobility/ADLs:  Walking   {CHP DME MVHQ:469629528}  Transfer  {CHP DME UXLK:440102725}  Bathing  {CHP DME DGUY:403474259}  Dressing  {CHP DME DGLO:756433295}  Toileting  {CHP DME JOAC:166063016}  Feeding  {CHP DME WFUX:323557322}  Med Admin  {CHP DME GURK:270623762}  Med Delivery   {MH COC  MED Delivery:304088264}    Wound Care Documentation and Therapy:        Elimination:  Continence:   Bowel: {YES / GB:15176}  Bladder: {YES / HY:07371}  Urinary Catheter: {Urinary Catheter:304088013}   Colostomy/Ileostomy/Ileal Conduit: {YES / GG:26948}       Date of Last BM: ***  No intake or output data in the 24 hours ending 12/09/23 1402  No intake/output data recorded.    Safety Concerns:     {MH COC Safety Concerns:304088272}    Impairments/Disabilities:      {MH COC Impairments/Disabilities:304088273}    Nutrition Therapy:  Current Nutrition Therapy:   {MH COC Diet List:304088271}    Routes of Feeding: {CHP DME Other Feedings:304088042}  Liquids: {Slp liquid thickness:30034}  Daily Fluid Restriction: {CHP DME Yes amt example:304088041}  Last Modified Barium Swallow with Video (Video Swallowing Test): {Done Not Done NIOE:703500938}    Treatments at the Time of Hospital Discharge:   Respiratory Treatments: ***  Oxygen Therapy:  {Therapy; copd oxygen:17808}  Ventilator:    {MH CC Vent HWEX:937169678}  Rehab Therapies: {THERAPEUTIC [phone]}  Weight Bearing Status/Restrictions: {MH CC Weight Bearing:304508812}  Other Medical Equipment (for information only, NOT a DME order):  {EQUIPMENT:304520077}  Other Treatments: ***    Patient's personal belongings (please select all that are sent with patient):  {CHP DME Belongings:304088044}    RN SIGNATURE:  {Esignature:304088025}    CASE MANAGEMENT/SOCIAL WORK SECTION    Inpatient Status Date: ***    Readmission Risk Assessment Score:  [facility] RISK OF UNPLANNED READMISSION 2.0             0 Total Score        Discharging to Facility/ Agency   Name:   Address:  Phone:  Fax:    Dialysis Facility (if applicable)   Name:  Address:  Dialysis Schedule:  Phone:  Fax:    Case Manager/Social Worker signature: {Esignature:304088025}    PHYSICIAN SECTION    Prognosis: {Prognosis:[phone]}    Condition at Discharge: {MH Patient Condition:304088024}    Rehab Potential  (if transferring to Rehab): {Prognosis:[phone]}    Recommended Labs or Other Treatments After Discharge: ***    Physician Certification: I certify the above information and transfer of Jesus Morgan  is necessary for the continuing treatment of the diagnosis listed and that he requires {Admit to Appropriate Level of Care:20763} for {GREATER/LESS:304500278} 30 days.     Update Admission H&P: {CHP DME Changes in WRUEA:540981191}    PHYSICIAN SIGNATURE:  {Esignature:304088025}

## 2023-12-10 LAB — EKG 12-LEAD
Atrial Rate: 48 {beats}/min
P Axis: 63 degrees
P-R Interval: 158 ms
Q-T Interval: 500 ms
QRS Duration: 92 ms
QTc Calculation (Bazett): 446 ms
R Axis: 78 degrees
T Axis: 78 degrees
Ventricular Rate: 48 {beats}/min

## 2024-09-05 ENCOUNTER — Inpatient Hospital Stay
Admit: 2024-09-05 | Discharge: 2024-09-05 | Disposition: A | Discharge status: Home / Self Care | Payer: Medicare (Managed Care) | Arrived: AM | Attending: Emergency Medicine

## 2024-09-05 DIAGNOSIS — I951 Orthostatic hypotension: Principal | ICD-10-CM

## 2024-09-05 LAB — CBC WITH AUTO DIFFERENTIAL
Basophils %: 0 % (ref 0.0–2.0)
Basophils Absolute: 0.02 10*3/uL (ref 0.00–0.20)
Eosinophils %: 2 % (ref 0–6)
Eosinophils Absolute: 0.11 10*3/uL (ref 0.05–0.50)
Hematocrit: 43.4 % (ref 37.0–54.0)
Hemoglobin: 14.6 g/dL (ref 12.5–16.5)
Immature Granulocytes %: 0 % (ref 0.0–5.0)
Immature Granulocytes Absolute: <0.03 10*3/uL (ref 0.00–0.58)
Lymphocytes %: 28 % (ref 20.0–42.0)
Lymphocytes Absolute: 1.68 10*3/uL (ref 1.50–4.00)
MCH: 30.9 pg (ref 26.0–35.0)
MCHC: 33.6 g/dL (ref 32.0–34.5)
MCV: 91.8 fL (ref 80.0–99.9)
MPV: 10.8 fL (ref 7.0–12.0)
Monocytes %: 7 % (ref 2.0–12.0)
Monocytes Absolute: 0.4 10*3/uL (ref 0.10–0.95)
Neutrophils %: 64 % (ref 43.0–80.0)
Neutrophils Absolute: 3.86 10*3/uL (ref 1.80–7.30)
Platelet, Fluorescence: 213 10*3/uL (ref 130–450)
RBC: 4.73 m/uL (ref 3.80–5.80)
RDW: 13.3 % (ref 11.5–15.0)
WBC: 6.1 10*3/uL (ref 4.5–11.5)

## 2024-09-05 LAB — BASIC METABOLIC PANEL
Anion Gap: 7 mmol/L (ref 7–16)
BUN: 15 mg/dL (ref 8–23)
CO2: 28 mmol/L (ref 22–29)
Calcium: 9.8 mg/dL (ref 8.8–10.2)
Chloride: 105 mmol/L (ref 98–107)
Creatinine: 0.9 mg/dL (ref 0.7–1.2)
Est, Glom Filt Rate: 82 mL/min/{1.73_m2} (ref >60)
Glucose: 95 mg/dL (ref 74–99)
Potassium: 4.6 mmol/L (ref 3.5–5.1)
Sodium: 140 mmol/L (ref 136–145)

## 2024-09-05 LAB — PLATELET CONFIRMATION

## 2024-09-05 LAB — TROPONIN: Troponin, High Sensitivity: 13 ng/L (ref 0–22)

## 2024-09-05 MED ORDER — SODIUM CHLORIDE 0.9 % IV BOLUS
0.9 | Freq: Once | INTRAVENOUS | Status: AC
Start: 2024-09-05 — End: 2024-09-05
  Administered 2024-09-05: 17:00:00 1000 mL via INTRAVENOUS

## 2024-09-05 NOTE — ED Notes (Signed)
 Ginger in er wr , please call pivot when able to visit

## 2024-09-05 NOTE — ED Provider Notes (Signed)
 [facility] EMERGENCY DEPARTMENT  EMERGENCY DEPARTMENT ENCOUNTER        Pt Name: Jesus Morgan  MRN: [id number]  Birthdate March 10, 1941  Date of evaluation: 09/05/2024  Provider: Ozell Hitchcock, DO  PCP: No primary care provider on file.  Note Started: 9:50 AM EST 09/05/24    CHIEF COMPLAINT       Chief Complaint   Patient presents with    Hypotension     Pt has a hx of hypotension, felt dizzy today, almost fell but wife caught him       HISTORY OF PRESENT ILLNESS: 1 or more Elements   History From: patient    Limitations to history : None    Jesus Morgan is a 84 y.o. male who presents to the ED for evaluation of a episode of lightheadedness and near syncope.  Patient has a history of hypotension and takes midodrine daily.  This morning he got lightheaded almost passed out.  Blood pressure was low.  Patient right now is asymptomatic.  Never experienced chest pain or shortness of breath.  Denied focal weakness or numbness.  No nausea or vomiting.  No diarrhea.  No recent URI symptoms consisting of cough or congestion.  Patient denies any confusion.  Denies any focal weakness or numbness.  No change in his vision.     Nursing Notes were all reviewed and agreed with or any disagreements were addressed in the HPI.      REVIEW OF EXTERNAL NOTE :        REVIEW OF SYSTEMS :           Positives and Pertinent negatives as per HPI.     SURGICAL HISTORY   History reviewed. No pertinent surgical history.    CURRENTMEDICATIONS       Previous Medications    DEXTROMETHORPHAN-GUAIFENESIN  (MUCINEX  DM MAXIMUM STRENGTH) 60-1200 MG TB12    Take 1 tablet by mouth every 12 hours as needed (cough)    TADALAFIL (CIALIS PO)    Take  by mouth.       ALLERGIES     Patient has no known allergies.    FAMILYHISTORY     History reviewed. No pertinent family history.     SOCIAL HISTORY       Social History     Tobacco Use    Smoking status: Never       SCREENINGS        Glasgow Coma Scale  Eye Opening: Spontaneous  Best  Verbal Response: Oriented  Best Motor Response: Obeys commands  Glasgow Coma Scale Score: 15                CIWA Assessment  BP: 137/70  Pulse: 50           PHYSICAL EXAM  1 or more Elements     ED Triage Vitals 09/05/24 0942   BP Girls Systolic BP Percentile Girls Diastolic BP Percentile Boys Systolic BP Percentile Boys Diastolic BP Percentile Temp Temp src Pulse   126/76 -- -- -- -- 98.2 F (36.8 C) -- 50      Respirations SpO2 Height Weight       16 98 % -- --           Constitutional/General: Alert and oriented x3  Head: Normocephalic and atraumatic  Eyes: PERRL, EOMI, conjunctiva normal, sclera non icteric  ENT:  Oropharynx clear, handling secretions,   Neck: Supple, full ROM, no stridor, no meningeal signs  Respiratory: Lungs  clear to auscultation bilaterally, no wheezes, rales, or rhonchi. Not in respiratory distress.  No conversational dyspnea or accessory muscle use  Cardiovascular:  Regular rate. Regular rhythm. No murmurs, no gallops, no rubs.  Chest: No chest wall tenderness  GI:  Abdomen Soft, Non tender, Non distended.  No rebound, guarding, or rigidity. No pulsatile masses.  Musculoskeletal: Moves all extremities x 4. Warm and well perfused, no clubbing, no cyanosis, no edema. Capillary refill <3 seconds  Integument: skin warm and dry. No rashes.  No diaphoresis or pallor  Neurologic: GCS 15, no focal deficits, symmetric strength 5/5 in the upper and lower extremities bilaterally  Psychiatric: Normal Affect            DIAGNOSTIC RESULTS   LABS:    Labs Reviewed   CBC WITH AUTO DIFFERENTIAL   PLATELET CONFIRMATION   TROPONIN   BASIC METABOLIC PANEL       As interpreted by me, the above displayed labs are abnormal. All other labs obtained during this visit were within normal range or not returned as of this dictation.      EKG Interpretation  Interpreted by emergency department physician, Ozell Hitchcock, DO      EKG Interpretation    Interpreted by emergency department physician    Rhythm: sinus  bradycardia  Rate: 50  Axis: normal  Ectopy: none  Conduction: normal  ST Segments: Normal  T Waves: normal  Q Waves: none    Clinical Impression: sinus bradycardia    Ozell Hitchcock, DO          RADIOLOGY:   Non-plain film images such as CT, Ultrasound and MRI are read by the radiologist. Plain radiographic images are visualized and preliminarily interpreted by the ED Provider with the below findings:      Interpretation per the Radiologist below, if available at the time of this note:    No orders to display     No results found.    No results found.    PROCEDURES   Unless otherwise noted below, none          CRITICAL CARE TIME (.cct)   NA    PAST MEDICAL HISTORY/Chronic Conditions Affecting Care      has a past medical history of Cancer (HCC).     EMERGENCY DEPARTMENT COURSE    Vitals:    Vitals:    09/05/24 0942 09/05/24 0945 09/05/24 1221 09/05/24 1351   BP: 126/76  137/70    Pulse: 50  50    Resp: 16   19   Temp: 98.2 F (36.8 C)      SpO2: 98% 98%  98%       Patient was given the following medications:  Medications   sodium chloride  0.9 % bolus 1,000 mL (1,000 mLs IntraVENous New Bag 09/05/24 1219)         Medical Decision Making/Differential Diagnosis:    CC/HPI Summary, Social Determinants of health, Records Reviewed, DDx, testing done/not done, ED Course, Reassessment, disposition considerations/shared decision making with patient, consults, disposition:      ED Course as of 09/05/24 1421   Thu Sep 05, 2024   1209 Patient resting comfortably in bed.  Currently asymptomatic.  Patient was orthostatic positive.  IV fluids ordered.  Patient's wife also states he is due for his noon dose of midodrine.  She will give it to him. [MS]   1420 Patient's orthostatics slightly positive however he does have a history of orthostasis.  They are overall  improved and he is feeling fine.  No symptoms.  He will be discharged home. [MS]      ED Course User Index  [MS] Fredric Sharper, DO        Medical Decision  Making  Amount and/or Complexity of Data Reviewed  Labs: ordered.  ECG/medicine tests: ordered.    Risk  Prescription drug management.        Patient presents to the ED for  evaluation of a episode of lightheadedness and near syncope.  Patient has a history of hypotension and takes midodrine daily.  This morning he got lightheaded almost passed out. Differential diagnoses included but not limited to orthostasis, cardiac dysrhythmia, electrolyte derangement, acute renal insufficiency, anginal equivalent. Workup in the ED consisted appropriate labs and imaging.  The following labs were evaluate interpreted by myself.  CBC shows no Insa leukocytosis or anemia.  BMP shows no electrolyte abnormalities or evidence of renal insufficiency.  Patient's troponin is 13 which is near previous baseline values. Patient was given IV fluids and his midodrine for their symptoms with moderate improvement. Patient continues to be non-toxic on re-evaluation. Findings were discussed with the patient and reasons to immediately return to the ED were articulated to them. They will follow-up with their PMD .      CONSULTS: (Who and What was discussed)  None        I am the Primary Clinician of Record.    FINAL IMPRESSION      1. Orthostasis          DISPOSITION/PLAN     DISPOSITION Decision To Discharge 09/05/2024 02:20:00 PM      PATIENT REFERRED TO:  Follow-up with your family physician            DISCHARGE MEDICATIONS:  New Prescriptions    No medications on file       DISCONTINUED MEDICATIONS:  Discontinued Medications    No medications on file              (Please note that portions of this note were completed with a voice recognition program.  Efforts were made to edit the dictations but occasionally words are mis-transcribed.)    Sharper Fredric, DO (electronically signed)           Fredric Sharper, DO  09/05/24 1421

## 2024-09-05 NOTE — ED Notes (Signed)
Blood work re-collected and sent to lab.

## 2024-09-07 LAB — EKG 12-LEAD
Atrial Rate: 50 {beats}/min
P Axis: 54 degrees
P-R Interval: 206 ms
Q-T Interval: 482 ms
QRS Duration: 96 ms
QTc Calculation (Bazett): 439 ms
R Axis: 57 degrees
T Axis: 66 degrees
Ventricular Rate: 50 {beats}/min
